# Patient Record
Sex: Female | Born: 2000 | Race: White | Hispanic: No | Marital: Single | Smoking: Never smoker
Health system: Southern US, Community
[De-identification: ages and names within clinical notes are randomized; demographics above are authoritative.]

---
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
## 2019-02-05 ENCOUNTER — Ambulatory Visit (INDEPENDENT_AMBULATORY_CARE_PROVIDER_SITE_OTHER): Payer: 59 | Admitting: "Endocrinology

## 2019-02-28 LAB — TSH: TSH: 28.13 mIU/L — ABNORMAL HIGH

## 2019-02-28 LAB — T3, FREE: T3, Free: 2.7 pg/mL — ABNORMAL LOW (ref 3.0–4.7)

## 2019-02-28 LAB — T4, FREE: Free T4: 0.9 ng/dL (ref 0.8–1.4)

## 2019-03-02 ENCOUNTER — Encounter (INDEPENDENT_AMBULATORY_CARE_PROVIDER_SITE_OTHER): Payer: Self-pay | Admitting: *Deleted

## 2019-03-02 DIAGNOSIS — E063 Autoimmune thyroiditis: Secondary | ICD-10-CM

## 2019-03-16 ENCOUNTER — Telehealth (INDEPENDENT_AMBULATORY_CARE_PROVIDER_SITE_OTHER): Payer: 59 | Admitting: "Endocrinology

## 2019-03-16 ENCOUNTER — Other Ambulatory Visit: Payer: Self-pay

## 2019-03-16 ENCOUNTER — Encounter (INDEPENDENT_AMBULATORY_CARE_PROVIDER_SITE_OTHER): Payer: Self-pay | Admitting: "Endocrinology

## 2019-03-16 DIAGNOSIS — E669 Obesity, unspecified: Secondary | ICD-10-CM | POA: Diagnosis not present

## 2019-03-16 DIAGNOSIS — R1013 Epigastric pain: Secondary | ICD-10-CM | POA: Diagnosis not present

## 2019-03-16 DIAGNOSIS — R5383 Other fatigue: Secondary | ICD-10-CM

## 2019-03-16 DIAGNOSIS — E063 Autoimmune thyroiditis: Secondary | ICD-10-CM

## 2019-03-16 DIAGNOSIS — Z68.41 Body mass index (BMI) pediatric, greater than or equal to 95th percentile for age: Secondary | ICD-10-CM

## 2019-03-16 MED ORDER — SYNTHROID 200 MCG PO TABS: ORAL_TABLET | ORAL | 6 refills | Status: DC

## 2019-03-16 NOTE — Patient Instructions (Signed)
Follow up visit in 3 months. Please repeat lab tests 1-2 weeks prior.  

## 2019-03-16 NOTE — Progress Notes (Signed)
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   HISTORY OF PRESENT ILLNESS:   [name] is a 19 y.o. Caucasian young lady.   Epsie was unaccompanied.   1. Genni's initial pediatric endocrine consultation occurred on 09/04/16:  A. Perinatal history: Delivered at 36 weeks due to preeclampsia; Birth weight 4 pounds, and 12 ounces; Healthy newborn, but did not breast feed well  B. Infancy: Recurrent bladder infections, so was evaluated at [facility]. Problem spontaneously resolved.  C. Childhood: Healthy except for eczema; Removal of wisdom teeth recently; No allergies to medications; No other allergies; Menarche at age 25; on OCPs for one year  D. Chief complaint:   1). When [name] was seen for contraceptive care on 08/06/16 by Dr. Mitchel Honour at [facility], Dr. Langston Masker noted fullness of the right thyroid gland. She ordered TFTs and a thyroid US. TSH was 55.6 (ref 0.5-4.3) [real normal range 0.50-3.40], free T4 0.7 (ref 0.8-1.5). Thyroid US showed an isthmus of 5 mm, right lobe 4.7 cm in longest dimension, and left lobe 3.9 cm in longest dimension.  A 5 mm punctate, anechoic cyst was noted in the inferior pole of the right lobe. The cyst was not large enough to meet the imaging criteria for either fine needle aspiration or dedicated follow up. Marked heterogeneity and hyperemia were noted that could be seen in the setting of thyroiditis. The radiologist noted that the thyroid gland was normal in size. [Unfortunately, the normal size range often used by radiologists is subject to selection bias and exceeds the true upper limit of normal size of 3.5 cm for the thyroid lobes.]    2).  In retrospect, the parents had noted an increase in fatigue and a decrease in activity level in the past two years, more marked in the past year. She had gained 30 pounds in the past year.   E. Pertinent family history:   1). Stature: Dad was 5-10. Mom was 5-3. Mom had menarche at age 68.    2). Obesity: Parents, maternal great grandmother, paternal great grandfather   3). DM: Maternal and paternal relatives in the grandparental generation.   4). Thyroid disease: None   5). ASCVD: Strokes on both sides in the grandparental generation    6). Cancers: None   7). Others: No known autoimmune diseases [Addendum 06/18/17: Mom is being tested for multiple sclerosis. Maternal aunt has fibromyalgia.]   F. Lifestyle:   1). Family diet: "Not good" with lots of carbs   2). Physical activities: Nothing much for the past two years  G. After reviewing her lab results from 09/04/16 I started her on Synthroid at a dose of 88 mcg/day.   2. Zuzu's last [facility] WebEx visit occurred on 11/05/18. At that visit I added omeprazole, 20 mg, twice daily and continued her Synthroid dose of 150 mcg/day. She was also talking sertraline and OCPs.  A. Later, after reviewing her TFT results from November, I increased the Synthroid dose to 175 mcg/day. She is now taking the Synthroid every day.   B. Her energy level is "good". She is still tired, but not as tired. She is usually colder than the friends. [name] has not had any significant hair loss or skin problems. Her  belly hunger (dyspepsia) is much better.   C. She has continued to take Zoloft. Her mood is better.   D. She is sleeping "more through the night". She does not have any insomnia or early awakening.   E. "I have not been doing much dieting." She has been walking most days for at least 30 minutes, but no yoga.     3. Pertinent Review of Systems:  Constitutional: [name] feels "good, no complaints". Energy is better. Stamina is better. She is  still cold. She is still "forgetful" at times.   Eyes: Vision seems to be good. There are no recognized eye problems. Neck: Her neck is "fine".  The patient has no complaints of soreness, tenderness, pressure, discomfort, or difficulty swallowing.   Heart: Heart rate increases with exercise or other physical activity. She has no complaints of palpitations, irregular heart beats, chest pain, or chest pressure.   Gastrointestinal: She has not had much belly hunger since starting the omeprazole. She has not had much abdominal bloating. There are no complaints of acid reflux, upset stomach, frequent stomach aches, diarrhea, or constipation.  Legs: Muscle mass and strength seem normal. There are no other  complaints of numbness, tingling, burning, or pain. No edema is noted.  Feet: There are no obvious foot problems. There are no complaints of numbness, tingling, burning, or pain. No edema is noted. Neurologic: There are no recognized problems with muscle movement and strength, sensation, or coordination. Skin: She does not have any skin or hair problems.    GYN: She does not have periods with her current birth control.   No past medical history on file.  Family History  Problem Relation Age of Onset  . Hypertension Mother   . Arthritis Mother   . Mental illness Father   . Hyperlipidemia Father   . Diabetes Maternal Grandmother   . Depression Maternal Grandmother   . Diabetes Maternal Grandfather   . Depression Maternal Grandfather   . Diabetes Paternal Grandmother   . Depression Paternal Grandmother   . Diabetes Paternal Grandfather   . Hyperlipidemia Paternal Grandfather   . Depression Paternal Grandfather      Current Outpatient Medications:  .  levothyroxine (SYNTHROID) 175 MCG tablet, Take 1 tablet (175 mcg total) by mouth daily before breakfast., Disp: 90 tablet, Rfl: 1 .  norethindrone-ethinyl estradiol (JUNEL FE,GILDESS FE,LOESTRIN FE) 1-20 MG-MCG tablet, Take 1 tablet by mouth  daily., Disp: , Rfl:  .  omeprazole (PRILOSEC) 20 MG capsule, Take one capsule, twice daily., Disp: 60 capsule, Rfl: 5 .  sertraline (ZOLOFT) 100 MG tablet, Take 100 mg by mouth daily., Disp: , Rfl:   Allergies as of 03/16/2019  . (No Known Allergies)     reports that she has never smoked. She has never used smokeless tobacco. Pediatric History  Patient Parents  . Overfelt,Natalie (Mother)  . Dark,Todd (Father)   Other Topics Concern  . Not on file  Social History Narrative  . Not on file    1. School and Family: She is in her sophomore year at [employer]. She is thinking about majoring in therapeutic recreation. She lives in an apartment house close to campus. Parents are divorced and shared custody until Kiannah was age 18.   2. Activities: None 3. Primary Care Provider: [facility] in [place] 4. GYN: Dr Mitchel Honour, [facility]  REVIEW OF SYSTEMS: There are no other significant problems involving Deyonna's other body systems.    Objective:  Objective  Vital  Signs:  There were no vitals taken for this visit.    Ht Readings from Last 3 Encounters:  12/09/17 5' 1.46" (1.561 m) (14 %, Z= -1.09)*  06/18/17 5' 1.42" (1.56 m) (14 %, Z= -1.10)*  03/19/17 5' 1.02" (1.55 m) (11 %, Z= -1.24)*   * Growth percentiles are based on CDC (Girls, 2-20 Years) data.   Wt Readings from Last 3 Encounters:  12/09/17 169 lb (76.7 kg) (93 %, Z= 1.45)*  06/18/17 176 lb (79.8 kg) (95 %, Z= 1.62)*  03/19/17 170 lb (77.1 kg) (94 %, Z= 1.52)*   * Growth percentiles are based on CDC (Girls, 2-20 Years) data.   HC Readings from Last 3 Encounters:  No data found for [facility]   There is no height or weight on file to calculate BSA. No height on file for this encounter. No weight on file for this encounter.  Weight at home this morning was 167 pounds. This is a 4 pound weight loss since her last televisit.   PHYSICAL EXAM:  Constitutional: The patient appears healthy, but still  obese. She is bright and alert today. Her affect and insight seem normal.  Her anterior neck looks normal today.    LAB DATA:   Results for orders placed or performed in visit on 12/15/18 (from the past 672 hour(s))  TSH   Collection Time: 02/27/19  3:09 PM  Result Value Ref Range   TSH 28.13 (H) mIU/L  T4, free   Collection Time: 02/27/19  3:09 PM  Result Value Ref Range   Free T4 0.9 0.8 - 1.4 ng/dL  T3, free   Collection Time: 02/27/19  3:09 PM  Result Value Ref Range   T3, Free 2.7 (L) 3.0 - 4.7 pg/mL    Labs 02/27/19: TSH 28.13, free T4 0.9, free T3 2.7   Labs 12/05/18: TSH 31.83, free T4 0.9, free T3 2.6  Labs 09/05/18: TSH 11.51, free T4 1.2, free T3 3.2  Labs 06/06/18: TSH 33.17 (my normals 0.5-3.4) , free T4 0.9 (ref 0.8-1.4), free T3 2.7 (ref 3.0-4.7)  Labs 12/09/17: CBG 97  Labs 09/21/17: TSH 0.35, free T4 1.3, Free T3 3.6  Labs 06/18/17: HbA1c 5.2%, CBG 87  Labs 06/07/17; TSH 3.58, free T4 1.2, free T3 3.2  Labs 03/19/17: TSH 9.30, free T4 1.0, free T3 3.1  Labs 11/19/16: TSH 2.00, free T4 1.2, free T3 3.4  Labs 09/04/16: TSH 98.02, free T4 0.8, free T3 2.9, TPO antibody 215 (ref <9), anti-thyroglobulin antibody 18 (ref <2), TSI <89  Labs 08/06/16: TSH 55.6 (ref 0.5-4.3), free T4 0.7 (ref 0.8-1.4)  Labs 02/24/15: WBC 9.8, Hgb 13.2, Hct 41.4%    Assessment and Plan:   ASSESSMENT:   1-3. Goiter, autoimmune thyroiditis, and acquired autoimmune hypothyroidism:  A. Tyrena has acquired primary hypothyroidism due to Hashimoto's thyroiditis. Given the magnitude of the TSH value in July 2018, it was highly likely that this was permanent hypothyroidism, rather than temporary. Given her lack of thyroid surgery, thyroid irradiation, and being on a low iodine diet, it was virtually certain that her hypothyroidism was due to destruction of thyrocytes by Hashimoto's disease T lymphocytes.  B. In August 2018, her TSH was even higher and both anti-thyroid antibodies were elevated,  confirming the clinical diagnosis of acquired primary hypothyroidism due to Hashimoto's thyroiditis. I started her on Synthroid then.  C. During the past three years her TFTs have fluctuated widely, in part due to her progressively losing more thyrocytes due to Hashimoto's  disease, and in part due to her not always taking her Synthroid. We have had to progressively increase her Synthroid doses over time.   D. Today she is still clinically hypothyroid, but somewhat better. She is still chemically hypothyroid, but somewhat better. She needs a further increase in her Synthroid dose. She also needs to take the medication every day.   E. Her thyroiditis is clinically quiescent. Her goiter appears smaller today.   4. Obesity: Taneika has lost 4 pounds since her last televisit. She says her clothes are fitting better. She is walking almost every day, but is still not very serious about Eating Right.   5-8. Post-prandial bloating, GERD/ acid indigestion/dyspepsia: Most of these symptoms resolved soon after beginning treatment with Synthroid. In October 2020, however, she had had more dyspepsia.The dyspepsia has resolved with omeprazole.  9. Muscle aches/cramps: Resolved after treatment with Synthroid. 10. Fatigue: This problem is still active, paralleling her hypothyroidism   11. Tachycardia: Resolved.   PLAN:  1. Diagnostic: Repeat TFTs in late April.  2. Therapeutic: Continue omeprazole, 20 mg, twice daily. Increase the Synthroid dose to 200 mcg/day, but will adjust doses based upon her blood test responses. Eat Right Diet. Exercise for one hour per day.  3. Patient education: We discussed all of the above at great length, to include the natural course of Hashimoto's disease and hypothyroidism, the need to take Synthroid daily, and the likelihood that she has lost more thyrocytes over time and required higher doses of Synthroid. We also discussed her obesity, the need to Eat Right, and the need to exercise  daily. 4. Follow-up: 3 months  Level of Service: This visit lasted in excess of 55 minutes. More than 50% of the visit was devoted to counseling.   Tillman Sers, MD, CDE Pediatric and Adult Endocrinology   This is a Pediatric Specialist E-Visit follow up consult provided via WebEx. Grant Ruts consented to an E-Visit consult today.  Location of patient: Verlin is at her apartment.  Location of provider: Renee Rival is at his office.  Patient was referred by Einar Gip, MD   The following participants were involved in this E-Visit: [name] and Dr. Tobe Sos  Chief Complain/ Reason for E-Visit today: acquired primary hypothyroidism, thyroiditis, goiter, obesity, fatigue, postprandial bloating, dyspepsia Total time on call: 30 Follow up: 3 months

## 2019-06-13 LAB — T4, FREE: Free T4: 1 ng/dL (ref 0.8–1.4)

## 2019-06-13 LAB — T3, FREE: T3, Free: 3.2 pg/mL (ref 3.0–4.7)

## 2019-06-13 LAB — TSH: TSH: 9.68 mIU/L — ABNORMAL HIGH

## 2019-06-19 ENCOUNTER — Telehealth (INDEPENDENT_AMBULATORY_CARE_PROVIDER_SITE_OTHER): Payer: Self-pay

## 2019-06-19 DIAGNOSIS — E063 Autoimmune thyroiditis: Secondary | ICD-10-CM

## 2019-06-19 NOTE — Telephone Encounter (Signed)
David Stall, MD  [facility] Clinical Pool  Ladies:  Please inform Alison Patterson, 02/01/2000 that her TFTS are still low, but better. If she is taking the 200 mcg Synthroid tablets daily, please ask her to add one additional pill twice a week, such as Sundays and Wednesdays. If she is not taking the pills every day, please ask her to do so.   Clinical staff: Please submit a prescription for Synthroid, 200 mcg tablets, for the increased dosage if she needs it. Please order repeat TSH, free T4, and Free T3 to be done in 2 months. Thanks.  Dr. Fransico Michael    Attempted to reach patient, Left HIPAA approved voicemail for return phone call.

## 2019-06-23 NOTE — Telephone Encounter (Signed)
Patient called back - call back number is [phone]

## 2019-06-23 NOTE — Telephone Encounter (Signed)
Spoke with patient, relayed message from Dr. Fransico Michael.  Patient has been taking the 200 mcg everyday.  Verbalized understanding of adding an additional pill 2 days per week.  She states she currently has enough and does not need a refill at this time.  I also let her know that Dr. Fransico Michael wants her to have labs repeated in 2 months.  The orders for those labs were placed.

## 2019-06-23 NOTE — Telephone Encounter (Signed)
Attempted to reach patient, left HIPAA approved voicemail for return phone call.  

## 2019-09-05 LAB — T4, FREE: Free T4: 1 ng/dL (ref 0.8–1.4)

## 2019-09-05 LAB — TSH: TSH: 19.44 mIU/L — ABNORMAL HIGH

## 2019-09-05 LAB — T3, FREE: T3, Free: 2.7 pg/mL — ABNORMAL LOW (ref 3.0–4.7)

## 2019-09-15 ENCOUNTER — Other Ambulatory Visit (INDEPENDENT_AMBULATORY_CARE_PROVIDER_SITE_OTHER): Payer: Self-pay

## 2019-09-15 ENCOUNTER — Telehealth (INDEPENDENT_AMBULATORY_CARE_PROVIDER_SITE_OTHER): Payer: Self-pay

## 2019-09-15 MED ORDER — LEVOTHYROXINE SODIUM 125 MCG PO TABS: 250.0000 ug | ORAL_TABLET | Freq: Every day | ORAL | 1 refills | Status: AC

## 2019-09-15 NOTE — Telephone Encounter (Signed)
After getting further instruction from Dr. Fransico Michael, a new prescription for Synthroid 250 mcg/day has been sent into the pharmacy. I called Alison Patterson to let her know about the dosage change and new prescription. [name] understood and had no questions.

## 2019-09-15 NOTE — Telephone Encounter (Signed)
Called and spoke to Pukalani and relayed to her the result note per Dr. Fransico Michael. I asked her what dose of the Synthroid she is taking and she stated she is taking 200 mcg per day and may miss a day here or there. I will forward this information to Dr. Fransico Michael for further instructions.

## 2019-09-23 NOTE — Telephone Encounter (Signed)
Patient seen this date °

## 2019-09-23 NOTE — Telephone Encounter (Signed)
Patient seen this date

## 2020-05-20 ENCOUNTER — Other Ambulatory Visit (INDEPENDENT_AMBULATORY_CARE_PROVIDER_SITE_OTHER): Payer: Self-pay | Admitting: "Endocrinology
